# Patient Record
Sex: Female | Born: 1960 | Race: White | Hispanic: No | Marital: Married | Smoking: Never smoker
Health system: Southern US, Community
[De-identification: ages and names within clinical notes are randomized; demographics above are authoritative.]

## PROBLEM LIST (undated history)

## (undated) ENCOUNTER — Emergency Department (HOSPITAL_COMMUNITY): Payer: Medicare HMO

## (undated) DIAGNOSIS — F329 Major depressive disorder, single episode, unspecified: Secondary | ICD-10-CM

## (undated) DIAGNOSIS — C55 Malignant neoplasm of uterus, part unspecified: Secondary | ICD-10-CM

## (undated) DIAGNOSIS — N200 Calculus of kidney: Secondary | ICD-10-CM

## (undated) DIAGNOSIS — M797 Fibromyalgia: Secondary | ICD-10-CM

## (undated) DIAGNOSIS — F32A Depression, unspecified: Secondary | ICD-10-CM

## (undated) DIAGNOSIS — M1990 Unspecified osteoarthritis, unspecified site: Secondary | ICD-10-CM

## (undated) DIAGNOSIS — E7800 Pure hypercholesterolemia, unspecified: Secondary | ICD-10-CM

## (undated) DIAGNOSIS — F410 Panic disorder [episodic paroxysmal anxiety] without agoraphobia: Secondary | ICD-10-CM

## (undated) DIAGNOSIS — R5382 Chronic fatigue, unspecified: Secondary | ICD-10-CM

## (undated) HISTORY — DX: Calculus of kidney: N20.0

## (undated) HISTORY — DX: Depression, unspecified: F32.A

## (undated) HISTORY — DX: Fibromyalgia: M79.7

## (undated) HISTORY — PX: BREAST SURGERY: SHX581

## (undated) HISTORY — DX: Pure hypercholesterolemia, unspecified: E78.00

## (undated) HISTORY — PX: EYE SURGERY: SHX253

## (undated) HISTORY — DX: Major depressive disorder, single episode, unspecified: F32.9

## (undated) HISTORY — DX: Chronic fatigue, unspecified: R53.82

## (undated) HISTORY — DX: Malignant neoplasm of uterus, part unspecified: C55

## (undated) HISTORY — DX: Panic disorder (episodic paroxysmal anxiety): F41.0

---

## 1984-07-10 HISTORY — PX: TUBAL LIGATION: SHX77

## 2000-07-10 HISTORY — PX: APPENDECTOMY: SHX54

## 2001-01-22 ENCOUNTER — Other Ambulatory Visit: Admission: RE | Admit: 2001-01-22 | Discharge: 2001-01-22 | Payer: Self-pay | Admitting: Obstetrics and Gynecology

## 2001-01-28 ENCOUNTER — Encounter: Payer: Self-pay | Admitting: Obstetrics and Gynecology

## 2001-01-28 ENCOUNTER — Ambulatory Visit (HOSPITAL_COMMUNITY): Admission: RE | Admit: 2001-01-28 | Discharge: 2001-01-28 | Payer: Self-pay | Admitting: Obstetrics and Gynecology

## 2002-07-10 HISTORY — PX: VESICOVAGINAL FISTULA CLOSURE W/ TAH: SUR271

## 2004-07-13 ENCOUNTER — Inpatient Hospital Stay (HOSPITAL_COMMUNITY): Admission: RE | Admit: 2004-07-13 | Discharge: 2004-07-16 | Payer: Self-pay | Admitting: Obstetrics & Gynecology

## 2005-12-22 ENCOUNTER — Emergency Department (HOSPITAL_COMMUNITY): Admission: EM | Admit: 2005-12-22 | Discharge: 2005-12-22 | Payer: Self-pay | Admitting: Emergency Medicine

## 2005-12-29 ENCOUNTER — Emergency Department (HOSPITAL_COMMUNITY): Admission: EM | Admit: 2005-12-29 | Discharge: 2005-12-30 | Payer: Self-pay | Admitting: Emergency Medicine

## 2006-04-18 ENCOUNTER — Encounter: Admission: RE | Admit: 2006-04-18 | Discharge: 2006-04-18 | Payer: Self-pay | Admitting: Obstetrics and Gynecology

## 2007-10-31 ENCOUNTER — Emergency Department (HOSPITAL_COMMUNITY): Admission: EM | Admit: 2007-10-31 | Discharge: 2007-10-31 | Payer: Self-pay | Admitting: Emergency Medicine

## 2010-11-25 NOTE — H&P (Signed)
NAMEJOSILYN, Mendoza NO.:  [id number]   MEDICAL RECORD NO.:  [id number]           PATIENT TYPE:   LOCATION:                                FACILITY:  [facility]   PHYSICIAN:  Lazaro Arms, M.D.   DATE OF BIRTH:  Nov 14, 1960   DATE OF ADMISSION:  DATE OF DISCHARGE:  LH                                HISTORY & PHYSICAL   HISTORY OF PRESENT ILLNESS:  Kirsten Mendoza is a 50 year old female gravida 2, para  1, abortus 1 status post tubal ligation who has been followed in our office  for many, many years.  She did have a significant break in her care.  She  came in the office on May 12, 2004 having heavy bleeding with clots but  she could not take any contraception due to migraine headaches she gets  every time with them.  She was having severe cramping as well at that time.  She was placed on Megace and given Lortab for pain.  I saw her for an  ultrasound and indeed she does have an enlarged fibroid uterus with one  fibroid approximately 4.5 x 5 cm in diameter.  Her uterus is enlarged  approximately 12-14 weeks size and as a result after talking over the  options with [name] we are proceeding with an abdominal hysterectomy.  If  the ovaries are normal they will be left in place.   PAST MEDICAL HISTORY:  Significant for kidney stones.   PAST SURGICAL HISTORY:  She had a cesarean section.  She has also had a D&C  in the past.  She had a tubal ligation.   PAST OBSTETRICAL HISTORY:  As above.   ALLERGIES:  PENICILLIN.   REVIEW OF SYSTEMS:  Otherwise negative.   PHYSICAL EXAMINATION:  HEENT:  Unremarkable.  NECK:  Thyroid is normal.  LUNGS:  Clear.  HEART:  Regular rate and rhythm without regurgitation or gallop.  BREASTS:  Without mass, discharge, or skin changes.  ABDOMEN:  Benign.  No hepatosplenomegaly.  No masses.  PELVIC:  She has normal external genitalia.  The vagina is pink, moist.  No  discharge.  Cervix is parous without lesions.  Uterus is 12 weeks size.  Adnexa is negative.  EXTREMITIES:  Warm with no edema.  NEUROLOGIC:  Grossly intact.   IMPRESSION:  1.  Menometrorrhagia.  2.  Dysmenorrhea.  3.  Enlarged fibroid uterus.   PLAN:  Patient is admitted for an abdominal hysterectomy.  She understands  the risk of surgery including infection, bleeding, and damage to other  organs.  We discussed the alternatives.  The patient opted for abdominal  hysterectomy and she understands the benefits of surgery as well.  All  questions were answered.     Luth   LHE/MEDQ  D:  07/12/2004  T:  07/12/2004  Job:  [id number]

## 2010-11-25 NOTE — Discharge Summary (Signed)
[name], Kirsten Mendoza                ACCOUNT NO.:  [id number]   MEDICAL RECORD NO.:  [id number]          PATIENT TYPE:  INP   LOCATION:  A419                          FACILITY:  [facility]   PHYSICIAN:  Lazaro Arms, M.D.   DATE OF BIRTH:  1961/06/08   DATE OF ADMISSION:  07/13/2004  DATE OF DISCHARGE:  01/07/2006LH                                 DISCHARGE SUMMARY   DISCHARGE DIAGNOSES:  Status post an abdominal hysterectomy with anterior  abdominal wall scar revision.   POST DIAGNOSES:  Status post an abdominal hysterectomy with anterior  abdominal wall scar revision.   PROCEDURE:  Abdominal hysterectomy with anterior abdominal wall scar  revision.   Please refer to the transcribed history and physical for details of  admission to the hospital.   HOSPITAL COURSE:  Patient was admitted postoperatively.  Please refer to the  operative note for details.  She tolerated clear liquids and a regular diet.  She voided without symptoms.  She was ambulatory.  Her JP drain was left in  place and will be taken out as an outpatient.  Her postoperative hemoglobin  and hematocrit were stable at 12 and 34.  She was ambulatory, tolerated oral  pain medicine, was discharged home on the morning of postoperative day #3 in  good, stable condition.  She will follow up in the office next week to have  her incision examined and her drain removed.  If she has any difficulty with  voiding she will let us know.  She is discharged to home with Toradol,  Tylox, Levaquin, and Diflucan.      LHE/MEDQ  D:  08/17/2004  T:  08/17/2004  Job:  [id number]

## 2010-11-25 NOTE — Op Note (Signed)
Kirsten Mendoza, DOUBLE                ACCOUNT NO.:  [id number]   MEDICAL RECORD NO.:  [id number]          PATIENT TYPE:  AMB   LOCATION:  DAY                           FACILITY:  [facility]   PHYSICIAN:  Lazaro Arms, M.D.   DATE OF BIRTH:  07-Jul-1961   DATE OF PROCEDURE:  DATE OF DISCHARGE:                                 OPERATIVE REPORT   PREOPERATIVE DIAGNOSES:  1.  Menometrorrhagia.  2.  Dysmenorrhea.  3.  Fibroid uterus.   POSTOPERATIVE DIAGNOSES:  1.  Menometrorrhagia.  2.  Dysmenorrhea.  3.  Fibroid uterus.   PROCEDURE:  Abdominal hysterectomy with revision of previous scar.   SURGEON:  Lazaro Arms, M.D.   ANESTHESIA:  General endotracheal.   FINDINGS:  The patient had what I thought was a solitary fibroid in her  uterus at the time of her ultrasound.  She may have had other smaller ones,  but nothing dominant that was confirmed today.  Her tubes and ovaries were  otherwise normal.  There was no adhesive disease.  Everything appeared to be  normal.   DESCRIPTION OF PROCEDURE:  The patient was taken to the operating room and  placed in the supine position where she underwent general endotracheal  anesthesia.  She was prepped and draped in the usual sterile fashion.  A  Foley catheter was placed.  The vagina was prepped.  A vertical skin  incision was made left lateral of her previous scar and taken down to the  rectus fascia which was scored in the midline and extended superiorly and  inferiorly.  The muscles were divided.  The peritoneal cavity was entered.  A medium protractor was used to hold back the abdominal wall, and the upper  abdomen was packed away, and the patient was placed in the Trendelenburg  position.  The uterine cornua were grasped.  The left round ligament was  suture ligated and cut.  The right round ligament was suture ligated and  cut.  The uteroovarian ligament was cross-clamped, cut, and suture ligated.  The vesicouterine serosal implant was  taken down, and the uteroovarian  ligament on the right was clamped, cut, and suture ligated.  The uterine  vessels were skeletonized, clamped, cut, and suture ligated.  Serial  pedicles were taken down to the cervix through the cardinal ligament, each  pedicle being clamped, cut, and transfixion suture ligated.  Vaginal angles  were encountered.  The specimen was removed.  The vaginal angle sutures were  placed.  The vagina was closed with interrupted figure-of-eight sutures.   The patient tolerated the procedure well.  She experienced 150 mL of blood  loss.  Surgicel and Interceed was placed in the vaginal cuff to prevent  postoperative adhesions.  The pelvis had already been irrigated vigorously.  The subcutaneous tissue, fascia, muscle, and peritoneum were closed in a  modified Smead-Jones fashion, far-far, near-near, using a 0 PDS.  The  subcutaneous tissue was made hemostatic and irrigated.  This previous scar  was then removed sharply, and the subcutaneous tissue was made hemostatic  and irrigated.  The  subcutaneous 2-0 plain gut sutures were placed to  obliterate dead space, and a JP drain was placed in the right lower  quadrant.  The skin was closed using skin staples.  The patient tolerated  the procedure well.  She experienced 150 mL of blood loss and was taken to  the recovery room in good stable condition.  All counts were correct.     Luth   LHE/MEDQ  D:  07/13/2004  T:  07/13/2004  Job:  [id number]

## 2011-04-04 LAB — COMPREHENSIVE METABOLIC PANEL
ALT: 19
Alkaline Phosphatase: 44
BUN: 5 — ABNORMAL LOW
CO2: 28
Chloride: 105
GFR calc non Af Amer: >60
Glucose, Bld: 95
Potassium: 3.6
Sodium: 136
Total Bilirubin: 0.8

## 2011-04-04 LAB — DIFFERENTIAL
Basophils Absolute: 0
Basophils Relative: 0
Eosinophils Absolute: 0.1
Monocytes Relative: 9
Neutro Abs: 2.8
Neutrophils Relative %: 61

## 2011-04-04 LAB — CBC
HCT: 34.7 — ABNORMAL LOW
Hemoglobin: 12.2
RBC: 3.69 — ABNORMAL LOW
RDW: 12.7
WBC: 4.6

## 2011-04-04 LAB — APTT: aPTT: 32

## 2011-04-04 LAB — PROTIME-INR: INR: 1

## 2013-05-21 ENCOUNTER — Other Ambulatory Visit: Payer: Self-pay | Admitting: Obstetrics & Gynecology

## 2013-08-25 ENCOUNTER — Institutional Professional Consult (permissible substitution): Payer: Self-pay | Admitting: Pulmonary Disease

## 2013-09-08 ENCOUNTER — Other Ambulatory Visit (HOSPITAL_COMMUNITY): Payer: Self-pay

## 2013-09-12 ENCOUNTER — Institutional Professional Consult (permissible substitution): Payer: Self-pay | Admitting: Pulmonary Disease

## 2013-09-23 ENCOUNTER — Encounter: Payer: Self-pay | Admitting: Cardiovascular Disease

## 2013-09-23 ENCOUNTER — Other Ambulatory Visit (HOSPITAL_COMMUNITY): Payer: Self-pay | Admitting: Family Medicine

## 2013-09-23 ENCOUNTER — Ambulatory Visit (HOSPITAL_COMMUNITY): Payer: BC Managed Care – PPO | Attending: Cardiovascular Disease | Admitting: Radiology

## 2013-09-23 DIAGNOSIS — R0602 Shortness of breath: Secondary | ICD-10-CM | POA: Insufficient documentation

## 2013-09-23 DIAGNOSIS — E785 Hyperlipidemia, unspecified: Secondary | ICD-10-CM | POA: Insufficient documentation

## 2013-09-23 NOTE — Progress Notes (Signed)
Echocardiogram performed.  

## 2013-09-29 ENCOUNTER — Ambulatory Visit (INDEPENDENT_AMBULATORY_CARE_PROVIDER_SITE_OTHER): Payer: BC Managed Care – PPO | Admitting: Pulmonary Disease

## 2013-09-29 ENCOUNTER — Encounter: Payer: Self-pay | Admitting: Pulmonary Disease

## 2013-09-29 ENCOUNTER — Other Ambulatory Visit (INDEPENDENT_AMBULATORY_CARE_PROVIDER_SITE_OTHER): Payer: BC Managed Care – PPO

## 2013-09-29 VITALS — BP 136/78 | HR 87 | Ht 65.0 in | Wt 196.0 lb

## 2013-09-29 DIAGNOSIS — R0602 Shortness of breath: Secondary | ICD-10-CM

## 2013-09-29 LAB — CBC
HEMATOCRIT: 42.8 % (ref 36.0–46.0)
HEMOGLOBIN: 14.2 g/dL (ref 12.0–15.0)
MCHC: 33.2 g/dL (ref 30.0–36.0)
MCV: 92 fl (ref 78.0–100.0)
Platelets: 243 10*3/uL (ref 150.0–400.0)
RBC: 4.66 Mil/uL (ref 3.87–5.11)
RDW: 12.7 % (ref 11.5–14.6)
WBC: 6.8 10*3/uL (ref 4.5–10.5)

## 2013-09-29 NOTE — Patient Instructions (Signed)
Exercise regularly to try to lose weight We will arrange a pulmonary function test, Chest x-ray, blood work, and a stress test We will call you with those results We will see you back in 3 months or sooner if needed

## 2013-09-29 NOTE — Progress Notes (Signed)
Subjective:    Patient ID: Kirsten Mendoza, female    DOB: 10-Oct-1960, 53 y.o.   MRN: [id number]  HPI  Kirsten Mendoza is here to see me today because she has been having more shortness of breath.  She has gained 75 pounds in the last year and has developed shortness of breath with this.  She has noticed that just folding clothes and other activties with minimal exertion would make her short of breath.  Sometimes this comes on at rest "out of nowhere".  She definitely gets short of breath with climbing a flight of stairs.  She continues to walk but this has been limited by the weather lately. She typically walks about 1.5 miles at a time.  This has been getting a little harder as well.  She often feels weak and tired.  She feels like she can't get enough air in and feels that her breathing is "strong enough".  She frequently yawns to take a deeper breath.   She has never had a significant respiratory problem other than pneumonia three times as a teenager and once as an adolescent. She was never hospitlized. She had "walking pneumonia" as an adult once.      Past Medical History  Diagnosis Date  . High cholesterol   . hx of uterine cancer   . Kidney stones   . Fibromyalgia   . Chronic fatigue   . Depression   . Panic attacks      Family History  Problem Relation Age of Onset  . Allergies Father   . Heart disease Maternal Grandmother   . Heart disease Maternal Grandfather   . Heart disease Paternal Grandfather   . Heart disease Paternal Grandmother   . Rheum arthritis Maternal Aunt   . Arthritis Mother   . Cancer Mother     breast cancer     History   Social History  . Marital Status: Married    Spouse Name: N/A    Number of Children: N/A  . Years of Education: N/A   Occupational History  . Not on file.   Social History Main Topics  . Smoking status: Never Smoker   . Smokeless tobacco: Never Used  . Alcohol Use: Yes     Comment: social per pt  . Drug Use: No  . Sexual  Activity: Not on file   Other Topics Concern  . Not on file   Social History Narrative  . No narrative on file     Allergies  Allergen Reactions  . Penicillins     Hives, throat swelling  . Sulfa Antibiotics     Hives, breathing difficulties     No outpatient prescriptions prior to visit.   No facility-administered medications prior to visit.      Review of Systems  Constitutional: Positive for fatigue and unexpected weight change. Negative for fever.  HENT: Positive for congestion, postnasal drip, sneezing and sore throat. Negative for dental problem, ear pain, nosebleeds, rhinorrhea, sinus pressure and trouble swallowing.   Eyes: Negative for redness and itching.  Respiratory: Positive for cough, chest tightness and shortness of breath. Negative for wheezing.   Cardiovascular: Negative for palpitations and leg swelling.  Gastrointestinal: Negative for nausea and vomiting.  Genitourinary: Negative for dysuria.  Musculoskeletal: Positive for joint swelling.  Skin: Negative for rash.  Neurological: Negative for headaches.  Hematological: Does not bruise/bleed easily.  Psychiatric/Behavioral: Positive for dysphoric mood. The patient is not nervous/anxious.        Objective:  Physical Exam Filed Vitals:   09/29/13 1535  BP: 136/78  Pulse: 87  Height: 5\' 5"  (1.651 m)  Weight: 196 lb (88.905 kg)  SpO2: 96%  RA  Gen: well appearing, no acute distress HEENT: NCAT, PERRL, EOMi, OP clear, neck supple without masses PULM: CTA B CV: RRR, no mgr, no JVD AB: BS+, soft, nontender, no hsm Ext: warm, no edema, no clubbing, no cyanosis Derm: no rash or skin breakdown Neuro: A&Ox4, CN II-XII intact, strength 5/5 in all 4 extremities  09/23/2013 Echo> Normal LVEF, mild LVH, normal RV/RA      Assessment & Plan:   Shortness of breath I explained to Kirsten Mendoza today that the differential diagnosis for shortness of breath is quite lengthy. It includes lung disease, cardiac  disease, hematologic disease, and neurologic disease. Today, her pulmonary exam is completely normal, as is her ambulatory oxygenation. We will check a chest x-ray as well as pulmonary function testing but I doubt we're going to find evidence of pulmonary disease. I am encouraged that she had a normal echocardiogram but I like to complete the cardiac workup with a stress test. I would also like to check a CBC to see what her hemoglobin shows. She has a normal neurologic exam so I do not anticipate there being any evidence of restrictive lung disease from a neurologic process.  If the above workup is negative then the most likely explanation for her shortness of breath would be deconditioning and obesity secondary to the profound weight gain from the last year.  Plan: -Full pulmonary function test -Chest x-ray -Stress test -CBC -TSH -Stay active and try to lose weight with diet and exercise    Updated Medication List Outpatient Encounter Prescriptions as of 09/29/2013  Medication Sig  . clonazePAM (KLONOPIN) 1 MG tablet Take 1 mg by mouth 4 (four) times daily as needed for anxiety.  . DULoxetine (CYMBALTA) 60 MG capsule Take 60 mg by mouth daily.  Kirsten Kitchen gabapentin (NEURONTIN) 300 MG capsule Take 300 mg by mouth 3 (three) times daily.  . simvastatin (ZOCOR) 10 MG tablet Take 10 mg by mouth daily.  Kirsten Kitchen zolpidem (AMBIEN) 5 MG tablet Take 5 mg by mouth at bedtime as needed for sleep.

## 2013-09-30 ENCOUNTER — Telehealth: Payer: Self-pay | Admitting: Emergency Medicine

## 2013-09-30 DIAGNOSIS — R0602 Shortness of breath: Secondary | ICD-10-CM | POA: Insufficient documentation

## 2013-09-30 NOTE — Assessment & Plan Note (Signed)
I explained to Kirsten Mendoza today that the differential diagnosis for shortness of breath is quite lengthy. It includes lung disease, cardiac disease, hematologic disease, and neurologic disease. Today, her pulmonary exam is completely normal, as is her ambulatory oxygenation. We will check a chest x-ray as well as pulmonary function testing but I doubt we're going to find evidence of pulmonary disease. I am encouraged that she had a normal echocardiogram but I like to complete the cardiac workup with a stress test. I would also like to check a CBC to see what her hemoglobin shows. She has a normal neurologic exam so I do not anticipate there being any evidence of restrictive lung disease from a neurologic process.  If the above workup is negative then the most likely explanation for her shortness of breath would be deconditioning and obesity secondary to the profound weight gain from the last year.  Plan: -Full pulmonary function test -Chest x-ray -Stress test -CBC -TSH -Stay active and try to lose weight with diet and exercise

## 2013-09-30 NOTE — Telephone Encounter (Signed)
Message copied by Maurice March on Tue Sep 30, 2013  5:28 PM ------      Message from: Juanito Doom      Created: Tue Sep 30, 2013  8:45 AM       E,            Please let her know that her ONO was OK.                  Thanks      B ------

## 2013-10-03 NOTE — Telephone Encounter (Signed)
lmtcb X 2 

## 2013-10-06 NOTE — Telephone Encounter (Signed)
Spoke with pt, she did not have an ONO done.

## 2013-10-29 ENCOUNTER — Encounter: Payer: BC Managed Care – PPO | Admitting: Nurse Practitioner

## 2013-12-04 ENCOUNTER — Ambulatory Visit (INDEPENDENT_AMBULATORY_CARE_PROVIDER_SITE_OTHER): Payer: BC Managed Care – PPO | Admitting: Pulmonary Disease

## 2013-12-04 ENCOUNTER — Ambulatory Visit (INDEPENDENT_AMBULATORY_CARE_PROVIDER_SITE_OTHER)
Admission: RE | Admit: 2013-12-04 | Discharge: 2013-12-04 | Disposition: A | Discharge status: Home / Self Care | Payer: BC Managed Care – PPO | Source: Ambulatory Visit | Attending: Pulmonary Disease | Admitting: Pulmonary Disease

## 2013-12-04 DIAGNOSIS — R0602 Shortness of breath: Secondary | ICD-10-CM

## 2013-12-04 NOTE — Progress Notes (Signed)
PFT done today. 

## 2013-12-05 ENCOUNTER — Encounter: Payer: BC Managed Care – PPO | Admitting: Nurse Practitioner

## 2013-12-05 ENCOUNTER — Encounter: Payer: Self-pay | Admitting: Pulmonary Disease

## 2013-12-05 NOTE — Progress Notes (Signed)
Quick Note:  Spoke with pt, she is aware of results. Nothing further needed at this time. ______ 

## 2013-12-05 NOTE — Progress Notes (Signed)
Quick Note:  lmtcb X1 ______ 

## 2013-12-08 ENCOUNTER — Telehealth: Payer: Self-pay | Admitting: Pulmonary Disease

## 2013-12-08 NOTE — Telephone Encounter (Signed)
Spoke with pt to relay results about pft.  She is aware.  Nothing further needed.

## 2013-12-08 NOTE — Progress Notes (Signed)
Quick Note:  Spoke with pt, she is aware of results. Nothing further needed at this time. ______ 

## 2014-01-14 ENCOUNTER — Encounter: Payer: BC Managed Care – PPO | Admitting: Nurse Practitioner

## 2014-04-16 LAB — PULMONARY FUNCTION TEST
DL/VA % PRED: 103 %
DL/VA: 4.96 ml/min/mmHg/L
DLCO UNC % PRED: 88 %
DLCO UNC: 21.42 ml/min/mmHg
FEF 25-75 POST: 3.63 L/s
FEF 25-75 PRE: 3.9 L/s
FEF2575-%Change-Post: -6 %
FEF2575-%PRED-PRE: 147 %
FEF2575-%Pred-Post: 137 %
FEV1-%Change-Post: 0 %
FEV1-%Pred-Post: 95 %
FEV1-%Pred-Pre: 95 %
FEV1-POST: 2.62 L
FEV1-Pre: 2.62 L
FEV1FVC-%CHANGE-POST: 2 %
FEV1FVC-%Pred-Pre: 107 %
FEV6-%CHANGE-POST: -2 %
FEV6-%PRED-POST: 88 %
FEV6-%PRED-PRE: 90 %
FEV6-Post: 2.98 L
FEV6-Pre: 3.04 L
FEV6FVC-%CHANGE-POST: 0 %
FEV6FVC-%PRED-POST: 102 %
FEV6FVC-%Pred-Pre: 102 %
FVC-%CHANGE-POST: -2 %
FVC-%PRED-POST: 85 %
FVC-%Pred-Pre: 87 %
FVC-PRE: 3.05 L
FVC-Post: 2.98 L
PRE FEV1/FVC RATIO: 86 %
Post FEV1/FVC ratio: 88 %
Post FEV6/FVC ratio: 100 %
Pre FEV6/FVC Ratio: 100 %
RV % pred: 61 %
RV: 1.14 L
TLC % pred: 84 %
TLC: 4.28 L

## 2016-04-10 ENCOUNTER — Other Ambulatory Visit: Payer: Self-pay | Admitting: Family Medicine

## 2016-04-10 DIAGNOSIS — Z1231 Encounter for screening mammogram for malignant neoplasm of breast: Secondary | ICD-10-CM

## 2016-05-30 ENCOUNTER — Other Ambulatory Visit: Payer: Self-pay | Admitting: Family Medicine

## 2016-05-30 DIAGNOSIS — Z803 Family history of malignant neoplasm of breast: Secondary | ICD-10-CM

## 2016-05-30 DIAGNOSIS — N6320 Unspecified lump in the left breast, unspecified quadrant: Secondary | ICD-10-CM

## 2016-05-30 DIAGNOSIS — R2232 Localized swelling, mass and lump, left upper limb: Secondary | ICD-10-CM

## 2016-06-09 ENCOUNTER — Ambulatory Visit
Admission: RE | Admit: 2016-06-09 | Discharge: 2016-06-09 | Disposition: A | Discharge status: Home / Self Care | Payer: PPO | Source: Ambulatory Visit | Attending: Family Medicine | Admitting: Family Medicine

## 2016-06-09 ENCOUNTER — Other Ambulatory Visit: Payer: Self-pay | Admitting: Family Medicine

## 2016-06-09 DIAGNOSIS — N6320 Unspecified lump in the left breast, unspecified quadrant: Secondary | ICD-10-CM

## 2016-06-09 DIAGNOSIS — R2232 Localized swelling, mass and lump, left upper limb: Secondary | ICD-10-CM

## 2016-06-09 DIAGNOSIS — Z803 Family history of malignant neoplasm of breast: Secondary | ICD-10-CM

## 2016-06-09 DIAGNOSIS — N6323 Unspecified lump in the left breast, lower outer quadrant: Secondary | ICD-10-CM | POA: Diagnosis not present

## 2016-06-09 DIAGNOSIS — R928 Other abnormal and inconclusive findings on diagnostic imaging of breast: Secondary | ICD-10-CM | POA: Diagnosis not present

## 2016-06-13 ENCOUNTER — Inpatient Hospital Stay: Admission: RE | Admit: 2016-06-13 | Payer: PPO | Source: Ambulatory Visit

## 2016-06-19 ENCOUNTER — Other Ambulatory Visit: Payer: Self-pay | Admitting: Family Medicine

## 2016-06-19 ENCOUNTER — Ambulatory Visit
Admission: RE | Admit: 2016-06-19 | Discharge: 2016-06-19 | Disposition: A | Discharge status: Home / Self Care | Payer: PPO | Source: Ambulatory Visit | Attending: Family Medicine | Admitting: Family Medicine

## 2016-06-19 DIAGNOSIS — Z803 Family history of malignant neoplasm of breast: Secondary | ICD-10-CM

## 2016-06-19 DIAGNOSIS — R2232 Localized swelling, mass and lump, left upper limb: Secondary | ICD-10-CM

## 2016-06-19 DIAGNOSIS — N6320 Unspecified lump in the left breast, unspecified quadrant: Secondary | ICD-10-CM

## 2016-06-19 DIAGNOSIS — N6323 Unspecified lump in the left breast, lower outer quadrant: Secondary | ICD-10-CM | POA: Diagnosis not present

## 2016-06-19 DIAGNOSIS — N6310 Unspecified lump in the right breast, unspecified quadrant: Secondary | ICD-10-CM

## 2016-06-19 DIAGNOSIS — N6012 Diffuse cystic mastopathy of left breast: Secondary | ICD-10-CM | POA: Diagnosis not present

## 2016-06-19 HISTORY — PX: BREAST BIOPSY: SHX20

## 2016-07-04 ENCOUNTER — Other Ambulatory Visit: Payer: PPO

## 2016-07-07 ENCOUNTER — Emergency Department (HOSPITAL_COMMUNITY)
Admission: EM | Admit: 2016-07-07 | Discharge: 2016-07-07 | Disposition: A | Discharge status: Home / Self Care | Payer: PPO | Attending: Emergency Medicine | Admitting: Emergency Medicine

## 2016-07-07 ENCOUNTER — Encounter (HOSPITAL_COMMUNITY): Payer: Self-pay | Admitting: Emergency Medicine

## 2016-07-07 ENCOUNTER — Emergency Department (HOSPITAL_COMMUNITY): Payer: PPO

## 2016-07-07 DIAGNOSIS — Z8542 Personal history of malignant neoplasm of other parts of uterus: Secondary | ICD-10-CM | POA: Diagnosis not present

## 2016-07-07 DIAGNOSIS — R079 Chest pain, unspecified: Secondary | ICD-10-CM | POA: Diagnosis not present

## 2016-07-07 DIAGNOSIS — R200 Anesthesia of skin: Secondary | ICD-10-CM | POA: Diagnosis not present

## 2016-07-07 DIAGNOSIS — Z79899 Other long term (current) drug therapy: Secondary | ICD-10-CM | POA: Diagnosis not present

## 2016-07-07 DIAGNOSIS — R51 Headache: Secondary | ICD-10-CM | POA: Diagnosis not present

## 2016-07-07 DIAGNOSIS — R072 Precordial pain: Secondary | ICD-10-CM | POA: Diagnosis not present

## 2016-07-07 HISTORY — DX: Unspecified osteoarthritis, unspecified site: M19.90

## 2016-07-07 LAB — COMPREHENSIVE METABOLIC PANEL
ALBUMIN: 4 g/dL (ref 3.5–5.0)
ALT: 18 U/L (ref 14–54)
ANION GAP: 6 (ref 5–15)
AST: 23 U/L (ref 15–41)
Alkaline Phosphatase: 110 U/L (ref 38–126)
BUN: 6 mg/dL (ref 6–20)
CO2: 27 mmol/L (ref 22–32)
Calcium: 8.6 mg/dL — ABNORMAL LOW (ref 8.9–10.3)
Chloride: 101 mmol/L (ref 101–111)
Creatinine, Ser: 1.1 mg/dL — ABNORMAL HIGH (ref 0.44–1.00)
GFR calc Af Amer: >60 mL/min (ref >60)
GFR calc non Af Amer: 55 mL/min — ABNORMAL LOW (ref >60)
GLUCOSE: 145 mg/dL — AB (ref 65–99)
POTASSIUM: 3.4 mmol/L — AB (ref 3.5–5.1)
Sodium: 134 mmol/L — ABNORMAL LOW (ref 135–145)
Total Bilirubin: 0.5 mg/dL (ref 0.3–1.2)
Total Protein: 7.5 g/dL (ref 6.5–8.1)

## 2016-07-07 LAB — CBC WITH DIFFERENTIAL/PLATELET
BASOS ABS: 0 10*3/uL (ref 0.0–0.1)
Basophils Relative: 1 %
EOS PCT: 7 %
Eosinophils Absolute: 0.5 10*3/uL (ref 0.0–0.7)
HEMATOCRIT: 41.9 % (ref 36.0–46.0)
Hemoglobin: 13.8 g/dL (ref 12.0–15.0)
LYMPHS ABS: 2.5 10*3/uL (ref 0.7–4.0)
LYMPHS PCT: 37 %
MCH: 30.3 pg (ref 26.0–34.0)
MCHC: 32.9 g/dL (ref 30.0–36.0)
MCV: 92.1 fL (ref 78.0–100.0)
MONO ABS: 0.3 10*3/uL (ref 0.1–1.0)
Monocytes Relative: 4 %
NEUTROS ABS: 3.5 10*3/uL (ref 1.7–7.7)
Neutrophils Relative %: 51 %
PLATELETS: 234 10*3/uL (ref 150–400)
RBC: 4.55 MIL/uL (ref 3.87–5.11)
RDW: 13 % (ref 11.5–15.5)
WBC: 6.7 10*3/uL (ref 4.0–10.5)

## 2016-07-07 LAB — TROPONIN I: Troponin I: <0.03 ng/mL (ref <0.03)

## 2016-07-07 MED ORDER — SODIUM CHLORIDE 0.9 % IV BOLUS (SEPSIS)
1000.0000 mL | Freq: Once | INTRAVENOUS | Status: AC
  Administered 2016-07-07: 1000 mL via INTRAVENOUS

## 2016-07-07 MED ORDER — IOPAMIDOL (ISOVUE-370) INJECTION 76%
100.0000 mL | Freq: Once | INTRAVENOUS | Status: AC | PRN
  Administered 2016-07-07: 100 mL via INTRAVENOUS

## 2016-07-07 MED ORDER — HYDROMORPHONE HCL 1 MG/ML IJ SOLN: 1.0000 mg | Freq: Once | INTRAMUSCULAR | Status: DC

## 2016-07-07 MED ORDER — FENTANYL CITRATE (PF) 100 MCG/2ML IJ SOLN
50.0000 ug | Freq: Once | INTRAMUSCULAR | Status: AC
  Administered 2016-07-07: 50 ug via INTRAVENOUS
  Filled 2016-07-07: qty 2

## 2016-07-07 MED ORDER — DICYCLOMINE HCL 10 MG PO CAPS: 10.0000 mg | ORAL_CAPSULE | Freq: Once | ORAL | Status: DC

## 2016-07-07 MED ORDER — KETOROLAC TROMETHAMINE 30 MG/ML IJ SOLN: 15.0000 mg | Freq: Once | INTRAMUSCULAR | Status: DC

## 2016-07-07 NOTE — ED Triage Notes (Signed)
Per Husband, pt reports pain in left chest radiating to left arm, left arm numbness, right arm numbness, mouth numbness, slurred speech, and difficulty speaking.  Symptoms started around 1700 per husband.  Dr. Dayna Barker at bedside evaluating pt at this time.

## 2016-07-07 NOTE — ED Notes (Signed)
ED Provider at bedside. 

## 2016-07-07 NOTE — ED Provider Notes (Signed)
[facility] DEPT Provider Note   CSN: [id number] Arrival date & time: 07/07/16  1711     History   Chief Complaint Chief Complaint  Patient presents with  . Extremity Weakness  . Chest Pain    HPI ARDEN BEEL is a 55 y.o. female.   Chest Pain   This is a new problem. The current episode started less than 1 hour ago. The problem occurs constantly. The problem has been resolved. The pain is associated with rest. The pain is present in the substernal region. The pain is moderate. The quality of the pain is described as brief. The pain does not radiate. Associated symptoms include headaches. Pertinent negatives include no cough, no fever, no nausea and no shortness of breath. She has tried nothing for the symptoms. The treatment provided no relief.    Past Medical History:  Diagnosis Date  . Arthritis   . Chronic fatigue   . Depression   . Fibromyalgia   . High cholesterol   . hx of uterine cancer   . Kidney stones   . Panic attacks     Patient Active Problem List   Diagnosis Date Noted  . Shortness of breath 09/30/2013    Past Surgical History:  Procedure Laterality Date  . APPENDECTOMY  2002  . BREAST SURGERY     biopsy  . CESAREAN SECTION  1981  . EYE SURGERY     lasic  . TUBAL LIGATION  1986  . VESICOVAGINAL FISTULA CLOSURE W/ TAH  2004    OB History    No data available       Home Medications    Prior to Admission medications   Medication Sig Start Date End Date Taking? Authorizing Provider  clonazePAM (KLONOPIN) 1 MG tablet Take 1 mg by mouth 4 (four) times daily as needed for anxiety.   Yes Historical Provider, MD  DULoxetine (CYMBALTA) 60 MG capsule Take 60 mg by mouth daily.   Yes Historical Provider, MD  gabapentin (NEURONTIN) 600 MG tablet Take 300 mg by mouth 3 (three) times daily.   Yes Historical Provider, MD  traMADol (ULTRAM) 50 MG tablet Take 50 mg by mouth every 6 (six) hours as needed for moderate pain or severe pain.   Yes  Historical Provider, MD  zolpidem (AMBIEN) 5 MG tablet Take 5 mg by mouth at bedtime as needed for sleep.   Yes Historical Provider, MD    Family History Family History  Problem Relation Age of Onset  . Allergies Father   . Heart disease Maternal Grandmother   . Heart disease Maternal Grandfather   . Heart disease Paternal Grandfather   . Heart disease Paternal Grandmother   . Rheum arthritis Maternal Aunt   . Arthritis Mother   . Cancer Mother     breast cancer    Social History Social History  Substance Use Topics  . Smoking status: Never Smoker  . Smokeless tobacco: Never Used  . Alcohol use Yes     Comment: social per pt     Allergies   Penicillins and Sulfa antibiotics   Review of Systems Review of Systems  Constitutional: Negative for chills and fever.  Eyes: Negative for pain.  Respiratory: Negative for cough and shortness of breath.   Cardiovascular: Positive for chest pain.  Gastrointestinal: Negative for nausea.  Neurological: Positive for headaches.  All other systems reviewed and are negative.    Physical Exam Updated Vital Signs BP 138/97   Pulse 85  Temp 98 F (36.7 C) (Oral)   Resp 26   Ht 5\' 4"  (1.626 m)   Wt 180 lb (81.6 kg)   SpO2 100%   BMI 30.90 kg/m   Physical Exam  Constitutional: She is oriented to person, place, and time. She appears well-developed and well-nourished.  HENT:  Head: Normocephalic and atraumatic.  Eyes: Conjunctivae and EOM are normal.  Neck: Normal range of motion.  Cardiovascular: Normal rate and regular rhythm.   Pulmonary/Chest: Effort normal. No stridor. No respiratory distress.  Abdominal: Soft. She exhibits no distension. There is no tenderness.  Musculoskeletal: Normal range of motion. She exhibits no edema or deformity.  Neurological: She is alert and oriented to person, place, and time.  No altered mental status, able to give full seemingly accurate history.  Face is symmetric, EOM's intact, pupils  equal and reactive, vision intact, tongue and uvula midline without deviation Upper and Lower extremity motor 5/5, intact pain perception in distal extremities, 2+ reflexes in biceps, patella and achilles tendons. Finger to nose normal, heel to shin normal.   Skin: Skin is warm and dry.  Nursing note and vitals reviewed.    ED Treatments / Results  Labs (all labs ordered are listed, but only abnormal results are displayed) Labs Reviewed  COMPREHENSIVE METABOLIC PANEL - Abnormal; Notable for the following:       Result Value   Sodium 134 (*)    Potassium 3.4 (*)    Glucose, Bld 145 (*)    Creatinine, Ser 1.10 (*)    Calcium 8.6 (*)    GFR calc non Af Amer 55 (*)    All other components within normal limits  CBC WITH DIFFERENTIAL/PLATELET  TROPONIN I  TROPONIN I  URINALYSIS, ROUTINE W REFLEX MICROSCOPIC    EKG  EKG Interpretation None       Radiology Ct Head Wo Contrast  Result Date: 07/07/2016 CLINICAL DATA:  Per Husband, pt reports pain in left chest radiating to left arm, left arm numbness, right arm numbness, mouth numbness, slurred speech, and difficulty speaking. Symptoms started around 1700 per husband. Hx of uterine cancer, breast surgery/bbj EXAM: CT HEAD WITHOUT CONTRAST TECHNIQUE: Contiguous axial images were obtained from the base of the skull through the vertex without intravenous contrast. COMPARISON:  PET-CT 10/31/2007 FINDINGS: Brain: No evidence of acute infarction, hemorrhage, hydrocephalus, extra-axial collection or mass lesion/mass effect. Vascular: No hyperdense vessel or unexpected calcification. Skull: Normal. Negative for fracture or focal lesion. Sinuses/Orbits: No acute finding. Other: None. IMPRESSION: No acute intracranial findings. Electronically Signed   By: Suzy Bouchard M.D.   On: 07/07/2016 19:02   Ct Angio Chest Aorta W And/or Wo Contrast  Result Date: 07/07/2016 CLINICAL DATA:  Per Husband, pt reports pain in left chest radiating to left  arm, left arm numbness, right arm numbness, mouth numbness, slurred speech, and difficulty speaking. Symptoms started around 1700 per husband. Hx of uterine cancer, breast surgery EXAM: CT ANGIOGRAPHY CHEST WITH CONTRAST TECHNIQUE: Multidetector CT imaging of the chest was performed using the standard protocol during bolus administration of intravenous contrast. Multiplanar CT image reconstructions and MIPs were obtained to evaluate the vascular anatomy. CONTRAST:  100 cc Isovue 370 COMPARISON:  Chest x-ray 12/04/2013 FINDINGS: Cardiovascular: Heart size is normal. No imaged pericardial effusion or significant coronary artery calcifications. Pulmonary arteries are well opacified. There is no evidence for acute pulmonary embolus. Mediastinum/Nodes: The visualized portion of the thyroid gland has a normal appearance. Small, nonspecific mediastinal lymph nodes are present, measuring  up to 7 mm in short axis. Lungs/Pleura: There is atelectasis/scarring within the right upper lobe. No focal consolidations. No pleural effusions. No suspicious pulmonary nodules. Upper Abdomen: Gallbladder is present. No acute abnormality within the upper abdomen. Small, nonspecific periportal lymph nodes are present, measuring of 7 mm short axis. Musculoskeletal: No chest wall abnormality. No acute or significant osseous findings. Review of the MIP images confirms the above findings. IMPRESSION: 1. Technically adequate exam showing no acute pulmonary embolus. 2.  No evidence for acute  abnormality. 3. Nonspecific small mediastinal and periportal lymph nodes. Electronically Signed   By: Nolon Nations M.D.   On: 07/07/2016 19:09    Procedures Procedures (including critical care time)  Medications Ordered in ED Medications  sodium chloride 0.9 % bolus 1,000 mL (0 mLs Intravenous Stopped 07/07/16 2103)  fentaNYL (SUBLIMAZE) injection 50 mcg (50 mcg Intravenous Given 07/07/16 1746)  iopamidol (ISOVUE-370) 76 % injection 100 mL (100  mLs Intravenous Contrast Given 07/07/16 1823)     Initial Impression / Assessment and Plan / ED Course  I have reviewed the triage vital signs and the nursing notes.  Pertinent labs & imaging results that were available during my care of the patient were reviewed by me and considered in my medical decision making (see chart for details).  Clinical Course     Atypical for ACS, delta troponins and ecg ok.  Negative ct for PE/dissection/PTX/PNA or other acute causes Symptoms didn't return in ED even on monitor for >4.5 hours.   Plan for dc with pcp follow up.  Final Clinical Impressions(s) / ED Diagnoses   Final diagnoses:  Chest pain, unspecified type    New Prescriptions Discharge Medication List as of 07/07/2016 10:04 PM       Merrily Pew, MD 07/08/16 [id number]

## 2016-07-07 NOTE — ED Notes (Signed)
Advised pt we need urine states she just went and no one advised her we needed a sample

## 2016-07-07 NOTE — ED Notes (Signed)
Daughter Holley Dexter wants to be called when pt is discharged [phone]

## 2016-07-07 NOTE — ED Notes (Signed)
Patient transported to CT 

## 2016-07-18 ENCOUNTER — Other Ambulatory Visit: Payer: PPO

## 2016-07-24 DIAGNOSIS — N183 Chronic kidney disease, stage 3 (moderate): Secondary | ICD-10-CM | POA: Diagnosis not present

## 2016-07-24 DIAGNOSIS — M797 Fibromyalgia: Secondary | ICD-10-CM | POA: Diagnosis not present

## 2016-07-24 DIAGNOSIS — I517 Cardiomegaly: Secondary | ICD-10-CM | POA: Diagnosis not present

## 2016-07-24 DIAGNOSIS — I10 Essential (primary) hypertension: Secondary | ICD-10-CM | POA: Diagnosis not present

## 2016-07-25 ENCOUNTER — Other Ambulatory Visit: Payer: PPO

## 2016-07-26 ENCOUNTER — Other Ambulatory Visit: Payer: PPO

## 2016-08-09 ENCOUNTER — Other Ambulatory Visit: Payer: PPO

## 2016-08-23 ENCOUNTER — Ambulatory Visit
Admission: RE | Admit: 2016-08-23 | Discharge: 2016-08-23 | Disposition: A | Discharge status: Home / Self Care | Payer: PPO | Source: Ambulatory Visit | Attending: Family Medicine | Admitting: Family Medicine

## 2016-08-23 DIAGNOSIS — N6311 Unspecified lump in the right breast, upper outer quadrant: Secondary | ICD-10-CM | POA: Diagnosis not present

## 2016-08-23 DIAGNOSIS — N6310 Unspecified lump in the right breast, unspecified quadrant: Secondary | ICD-10-CM

## 2017-02-20 ENCOUNTER — Encounter: Payer: Self-pay | Admitting: Obstetrics & Gynecology

## 2017-03-05 ENCOUNTER — Encounter: Payer: Self-pay | Admitting: Obstetrics & Gynecology

## 2017-03-09 DIAGNOSIS — M797 Fibromyalgia: Secondary | ICD-10-CM | POA: Diagnosis not present

## 2017-03-09 DIAGNOSIS — K219 Gastro-esophageal reflux disease without esophagitis: Secondary | ICD-10-CM | POA: Diagnosis not present

## 2017-03-09 DIAGNOSIS — I10 Essential (primary) hypertension: Secondary | ICD-10-CM | POA: Diagnosis not present

## 2017-03-19 ENCOUNTER — Encounter: Payer: Self-pay | Admitting: Obstetrics & Gynecology

## 2017-03-30 ENCOUNTER — Encounter: Payer: Self-pay | Admitting: Obstetrics & Gynecology

## 2017-04-17 ENCOUNTER — Encounter: Payer: Self-pay | Admitting: Obstetrics & Gynecology

## 2018-06-04 IMAGING — CT CT HEAD W/O CM
3 series · 16 of 47 positions shown, 19 images · non-contrast
Comparison: PET-CT 10/31/2007

CLINICAL DATA: Per Husband, pt reports pain in left chest radiating
to left arm, left arm numbness, right arm numbness, mouth numbness,
slurred speech, and difficulty speaking. Symptoms started around
1422 per husband. Hx of uterine cancer, breast surgery/bbj

EXAM:
CT HEAD WITHOUT CONTRAST
TECHNIQUE: Contiguous axial images were obtained from the base of the skull
through the vertex without intravenous contrast.

[Series 2: head wo · axial · 0.45mm/px · z∈[+1507,+1642]mm · 10 of 33 slices shown, 13 images]
[im 3/33  brain]
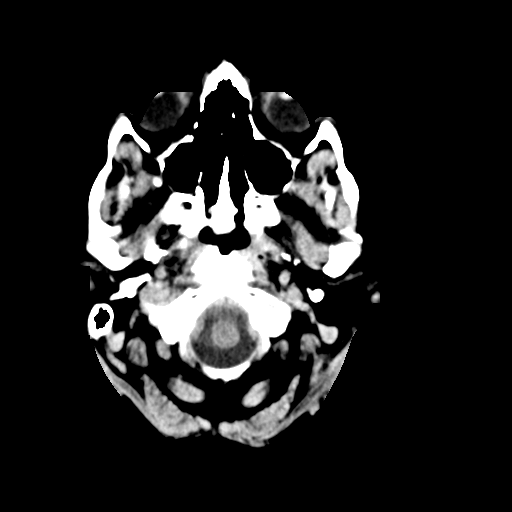
[im 3/33  bone]
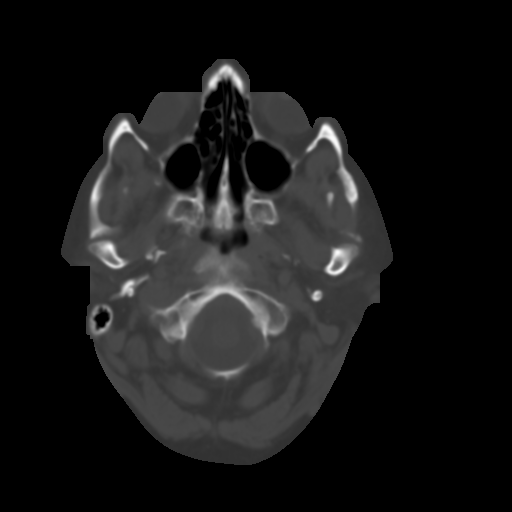
[im 6/33  brain]
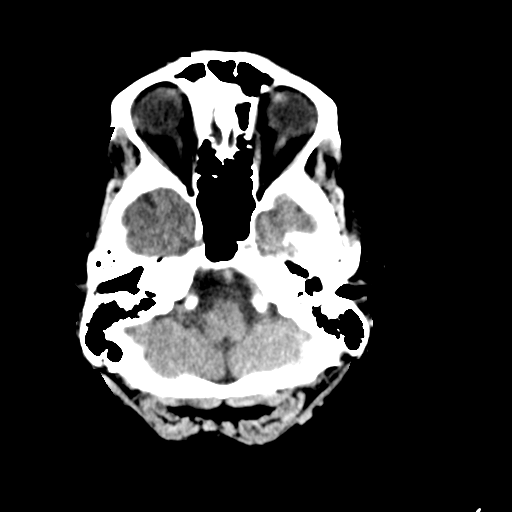
[im 9/33  brain]
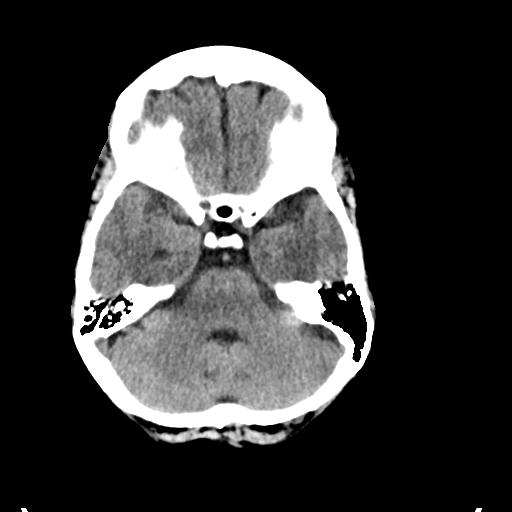
[im 12/33  brain]
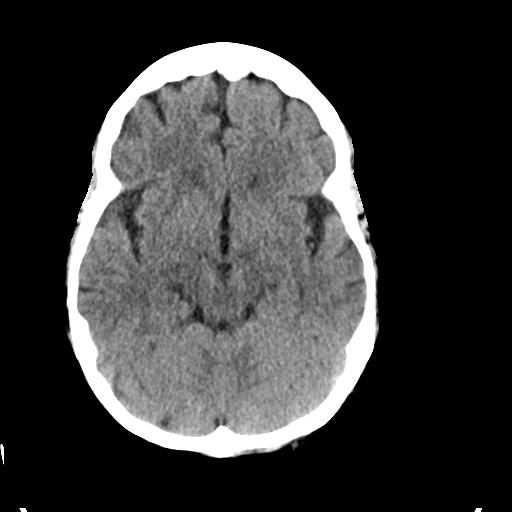
[im 15/33  brain]
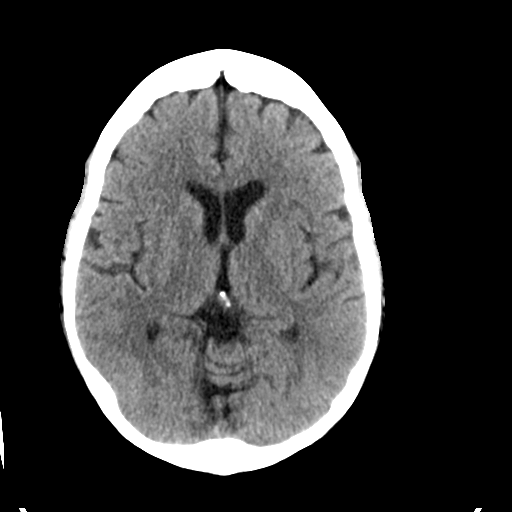
[im 15/33  bone]
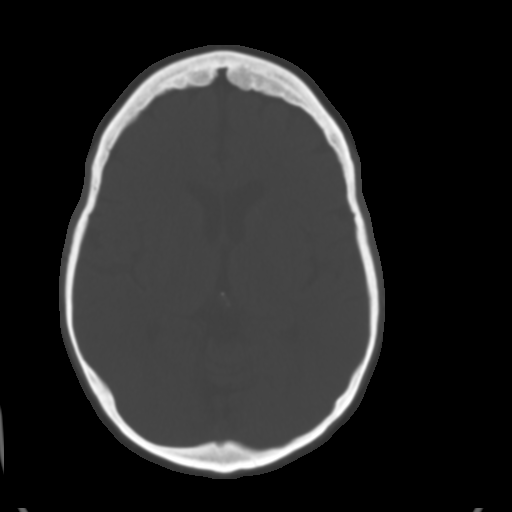
[im 18/33  brain]
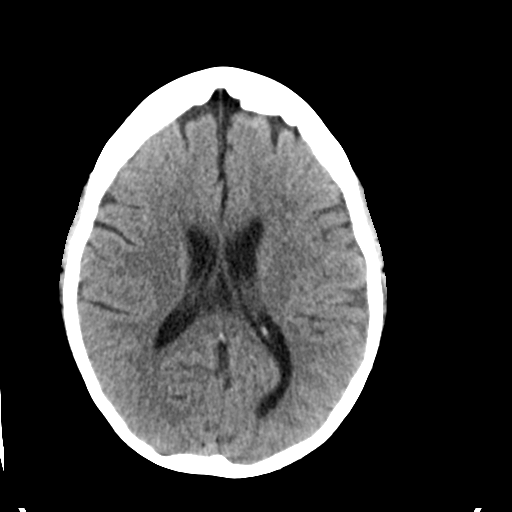
[im 21/33  brain]
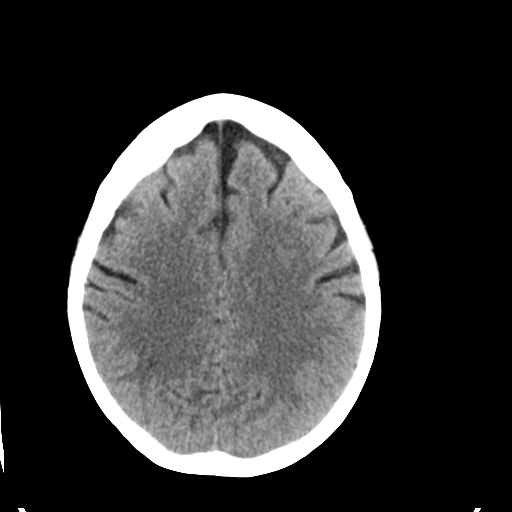
[im 25/33  brain]
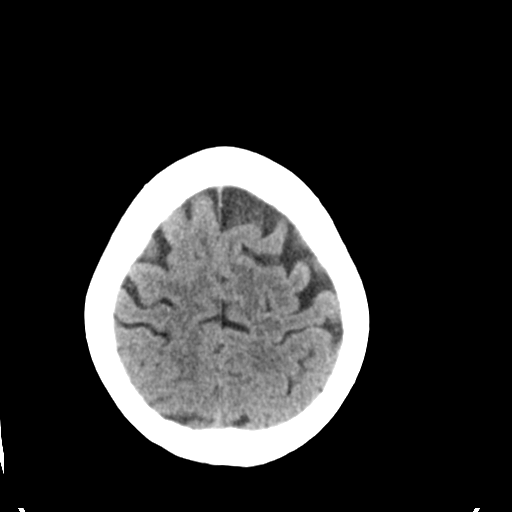
[im 27/33  brain]
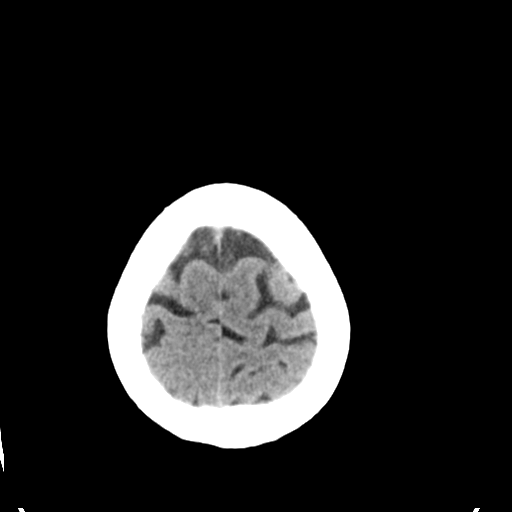
[im 27/33  bone]
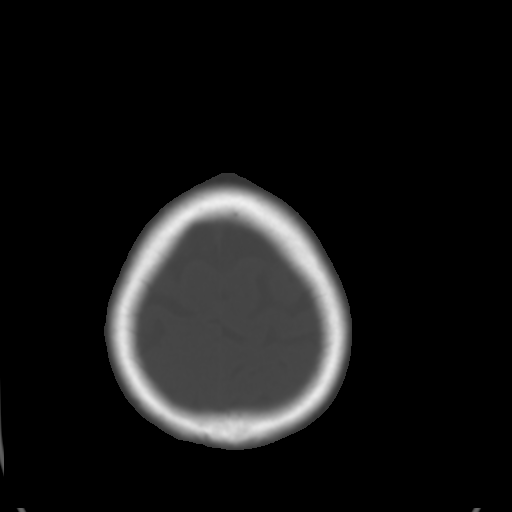
[im 30/33  brain]
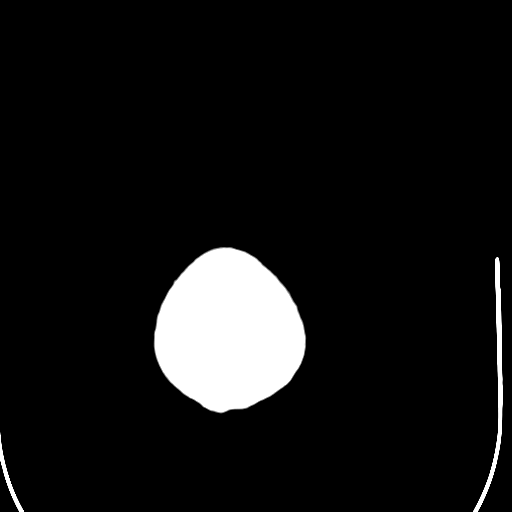

[Series 4: coronal soft tissue · coronal · 0.32mm/px · 3 of 68 slices shown]
[im 23/68  brain]
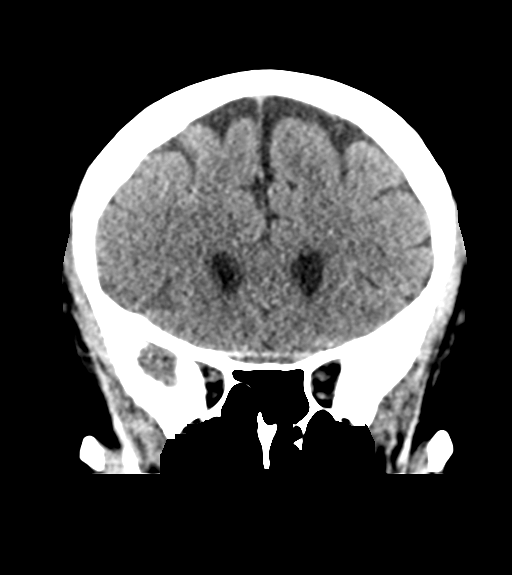
[im 30/68  brain]
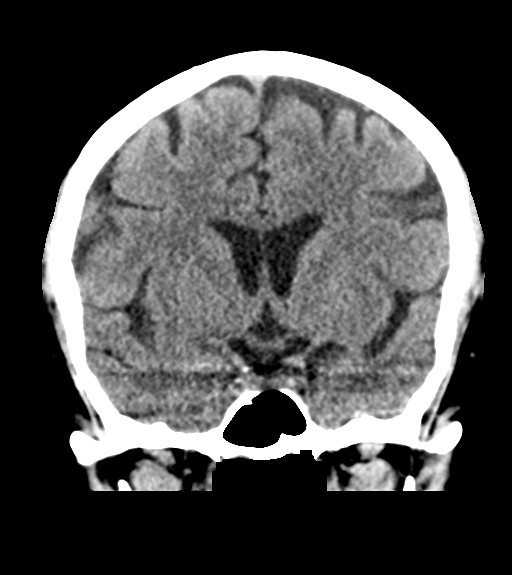
[im 38/68  brain]
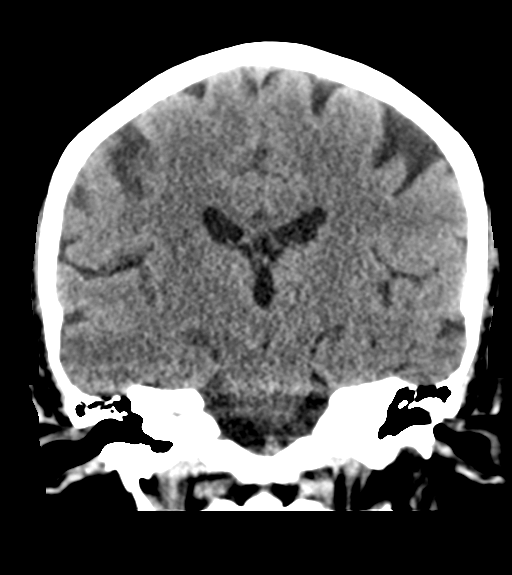

[Series 5: sagittal soft tissue · sagittal · 0.34mm/px · 3 of 67 slices shown]
[im 23/67  brain]
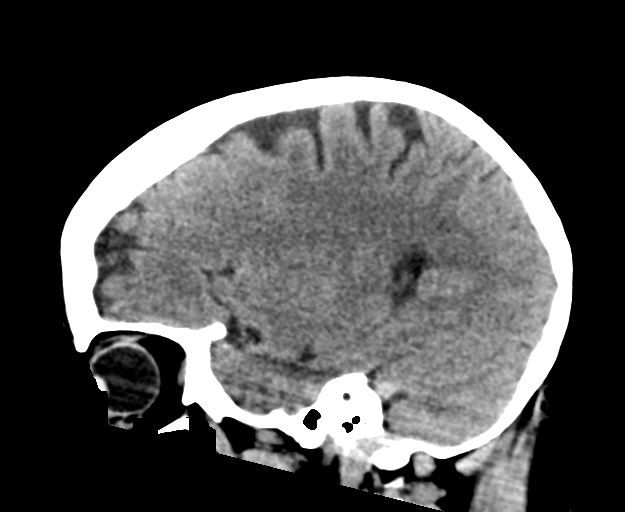
[im 34/67  brain]
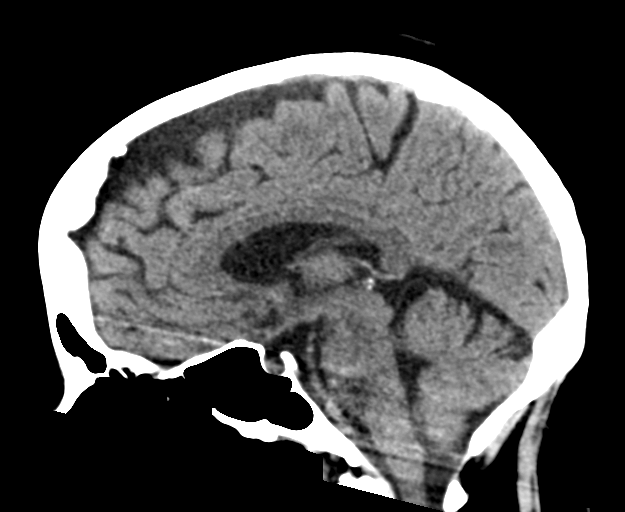
[im 45/67  brain]
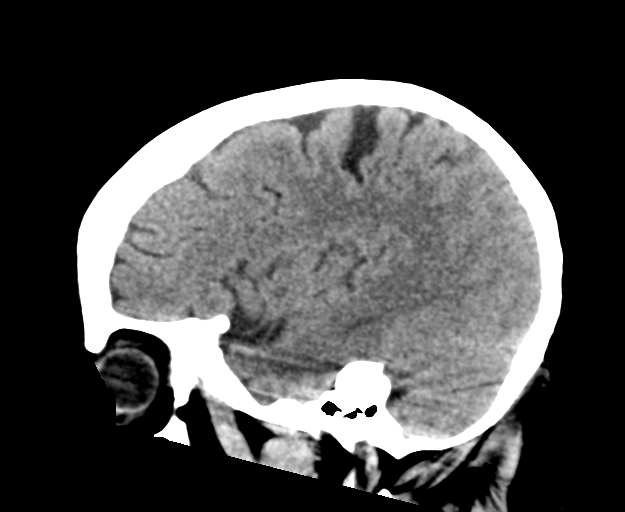

[16 of 47 positions shown; findings below may reference images not displayed]

FINDINGS: Brain: No evidence of acute infarction, hemorrhage, hydrocephalus,
extra-axial collection or mass lesion/mass effect.

Vascular: No hyperdense vessel or unexpected calcification.

Skull: Normal. Negative for fracture or focal lesion.

Sinuses/Orbits: No acute finding.

Other: None.
IMPRESSION: No acute intracranial findings.

## 2019-08-22 DIAGNOSIS — Z79899 Other long term (current) drug therapy: Secondary | ICD-10-CM | POA: Diagnosis not present

## 2019-08-22 DIAGNOSIS — F329 Major depressive disorder, single episode, unspecified: Secondary | ICD-10-CM | POA: Diagnosis not present

## 2019-08-22 DIAGNOSIS — M797 Fibromyalgia: Secondary | ICD-10-CM | POA: Diagnosis not present

## 2019-10-17 DIAGNOSIS — M797 Fibromyalgia: Secondary | ICD-10-CM | POA: Diagnosis not present

## 2019-10-17 DIAGNOSIS — F329 Major depressive disorder, single episode, unspecified: Secondary | ICD-10-CM | POA: Diagnosis not present

## 2019-10-17 DIAGNOSIS — Z79899 Other long term (current) drug therapy: Secondary | ICD-10-CM | POA: Diagnosis not present

## 2019-10-20 DIAGNOSIS — Z853 Personal history of malignant neoplasm of breast: Secondary | ICD-10-CM | POA: Diagnosis not present

## 2019-10-20 DIAGNOSIS — Z8542 Personal history of malignant neoplasm of other parts of uterus: Secondary | ICD-10-CM | POA: Diagnosis not present

## 2019-10-20 DIAGNOSIS — G4700 Insomnia, unspecified: Secondary | ICD-10-CM | POA: Diagnosis not present

## 2019-10-20 DIAGNOSIS — F419 Anxiety disorder, unspecified: Secondary | ICD-10-CM | POA: Diagnosis not present

## 2019-11-14 DIAGNOSIS — F329 Major depressive disorder, single episode, unspecified: Secondary | ICD-10-CM | POA: Diagnosis not present

## 2019-11-14 DIAGNOSIS — Z79899 Other long term (current) drug therapy: Secondary | ICD-10-CM | POA: Diagnosis not present

## 2019-11-14 DIAGNOSIS — M797 Fibromyalgia: Secondary | ICD-10-CM | POA: Diagnosis not present

## 2019-11-30 DIAGNOSIS — F411 Generalized anxiety disorder: Secondary | ICD-10-CM | POA: Diagnosis not present

## 2019-12-15 DIAGNOSIS — Z79899 Other long term (current) drug therapy: Secondary | ICD-10-CM | POA: Diagnosis not present

## 2019-12-15 DIAGNOSIS — F329 Major depressive disorder, single episode, unspecified: Secondary | ICD-10-CM | POA: Diagnosis not present

## 2019-12-15 DIAGNOSIS — F419 Anxiety disorder, unspecified: Secondary | ICD-10-CM | POA: Diagnosis not present

## 2019-12-15 DIAGNOSIS — M797 Fibromyalgia: Secondary | ICD-10-CM | POA: Diagnosis not present

## 2020-01-09 DIAGNOSIS — F329 Major depressive disorder, single episode, unspecified: Secondary | ICD-10-CM | POA: Diagnosis not present

## 2020-01-09 DIAGNOSIS — Z79899 Other long term (current) drug therapy: Secondary | ICD-10-CM | POA: Diagnosis not present

## 2020-01-09 DIAGNOSIS — M797 Fibromyalgia: Secondary | ICD-10-CM | POA: Diagnosis not present

## 2020-03-22 DIAGNOSIS — Z79899 Other long term (current) drug therapy: Secondary | ICD-10-CM | POA: Diagnosis not present

## 2020-03-22 DIAGNOSIS — F419 Anxiety disorder, unspecified: Secondary | ICD-10-CM | POA: Diagnosis not present

## 2020-03-22 DIAGNOSIS — G4700 Insomnia, unspecified: Secondary | ICD-10-CM | POA: Diagnosis not present

## 2020-03-22 DIAGNOSIS — F329 Major depressive disorder, single episode, unspecified: Secondary | ICD-10-CM | POA: Diagnosis not present

## 2020-03-22 DIAGNOSIS — M797 Fibromyalgia: Secondary | ICD-10-CM | POA: Diagnosis not present

## 2020-05-20 DIAGNOSIS — Z79899 Other long term (current) drug therapy: Secondary | ICD-10-CM | POA: Diagnosis not present

## 2020-05-20 DIAGNOSIS — Z9189 Other specified personal risk factors, not elsewhere classified: Secondary | ICD-10-CM | POA: Diagnosis not present

## 2020-05-20 DIAGNOSIS — M797 Fibromyalgia: Secondary | ICD-10-CM | POA: Diagnosis not present

## 2020-05-20 DIAGNOSIS — F329 Major depressive disorder, single episode, unspecified: Secondary | ICD-10-CM | POA: Diagnosis not present

## 2022-02-23 ENCOUNTER — Other Ambulatory Visit: Payer: Self-pay

## 2022-02-23 ENCOUNTER — Emergency Department (HOSPITAL_COMMUNITY): Payer: Medicare HMO

## 2022-02-23 ENCOUNTER — Emergency Department (HOSPITAL_COMMUNITY)
Admission: EM | Admit: 2022-02-23 | Discharge: 2022-02-23 | Disposition: A | Discharge status: Home / Self Care | Payer: Medicare HMO | Attending: Emergency Medicine | Admitting: Emergency Medicine

## 2022-02-23 ENCOUNTER — Encounter (HOSPITAL_COMMUNITY): Payer: Self-pay

## 2022-02-23 DIAGNOSIS — T424X1A Poisoning by benzodiazepines, accidental (unintentional), initial encounter: Secondary | ICD-10-CM | POA: Insufficient documentation

## 2022-02-23 DIAGNOSIS — R109 Unspecified abdominal pain: Secondary | ICD-10-CM | POA: Insufficient documentation

## 2022-02-23 DIAGNOSIS — N12 Tubulo-interstitial nephritis, not specified as acute or chronic: Secondary | ICD-10-CM | POA: Insufficient documentation

## 2022-02-23 DIAGNOSIS — F10120 Alcohol abuse with intoxication, uncomplicated: Secondary | ICD-10-CM | POA: Insufficient documentation

## 2022-02-23 DIAGNOSIS — R4781 Slurred speech: Secondary | ICD-10-CM | POA: Diagnosis not present

## 2022-02-23 DIAGNOSIS — F10920 Alcohol use, unspecified with intoxication, uncomplicated: Secondary | ICD-10-CM

## 2022-02-23 DIAGNOSIS — Z87442 Personal history of urinary calculi: Secondary | ICD-10-CM | POA: Diagnosis not present

## 2022-02-23 DIAGNOSIS — T50901A Poisoning by unspecified drugs, medicaments and biological substances, accidental (unintentional), initial encounter: Secondary | ICD-10-CM

## 2022-02-23 DIAGNOSIS — Y908 Blood alcohol level of 240 mg/100 ml or more: Secondary | ICD-10-CM | POA: Diagnosis not present

## 2022-02-23 LAB — COMPREHENSIVE METABOLIC PANEL
ALT: 23 U/L (ref 0–44)
AST: 22 U/L (ref 15–41)
Albumin: 4 g/dL (ref 3.5–5.0)
Alkaline Phosphatase: 75 U/L (ref 38–126)
Anion gap: 8 (ref 5–15)
BUN: 12 mg/dL (ref 8–23)
CO2: 23 mmol/L (ref 22–32)
Calcium: 9 mg/dL (ref 8.9–10.3)
Chloride: 108 mmol/L (ref 98–111)
Creatinine, Ser: 0.85 mg/dL (ref 0.44–1.00)
GFR, Estimated: >60 mL/min (ref >60)
Glucose, Bld: 111 mg/dL — ABNORMAL HIGH (ref 70–99)
Potassium: 3.6 mmol/L (ref 3.5–5.1)
Sodium: 139 mmol/L (ref 135–145)
Total Bilirubin: 0.6 mg/dL (ref 0.3–1.2)
Total Protein: 7.5 g/dL (ref 6.5–8.1)

## 2022-02-23 LAB — URINALYSIS, ROUTINE W REFLEX MICROSCOPIC
Bilirubin Urine: NEGATIVE
Glucose, UA: NEGATIVE mg/dL
Hgb urine dipstick: NEGATIVE
Ketones, ur: NEGATIVE mg/dL
Leukocytes,Ua: NEGATIVE
Nitrite: NEGATIVE
Protein, ur: NEGATIVE mg/dL
Specific Gravity, Urine: 1.006 (ref 1.005–1.030)
pH: 6 (ref 5.0–8.0)

## 2022-02-23 LAB — CBC WITH DIFFERENTIAL/PLATELET
Abs Immature Granulocytes: 0.03 10*3/uL (ref 0.00–0.07)
Basophils Absolute: 0.1 10*3/uL (ref 0.0–0.1)
Basophils Relative: 1 %
Eosinophils Absolute: 0.4 10*3/uL (ref 0.0–0.5)
Eosinophils Relative: 4 %
HCT: 42.9 % (ref 36.0–46.0)
Hemoglobin: 14.5 g/dL (ref 12.0–15.0)
Immature Granulocytes: 0 %
Lymphocytes Relative: 33 %
Lymphs Abs: 3.8 10*3/uL (ref 0.7–4.0)
MCH: 31.9 pg (ref 26.0–34.0)
MCHC: 33.8 g/dL (ref 30.0–36.0)
MCV: 94.3 fL (ref 80.0–100.0)
Monocytes Absolute: 0.9 10*3/uL (ref 0.1–1.0)
Monocytes Relative: 8 %
Neutro Abs: 6.1 10*3/uL (ref 1.7–7.7)
Neutrophils Relative %: 54 %
Platelets: 255 10*3/uL (ref 150–400)
RBC: 4.55 MIL/uL (ref 3.87–5.11)
RDW: 12.1 % (ref 11.5–15.5)
WBC: 11.3 10*3/uL — ABNORMAL HIGH (ref 4.0–10.5)
nRBC: 0 % (ref 0.0–0.2)

## 2022-02-23 LAB — RAPID URINE DRUG SCREEN, HOSP PERFORMED
Amphetamines: NOT DETECTED
Barbiturates: NOT DETECTED
Benzodiazepines: POSITIVE — AB
Cocaine: NOT DETECTED
Opiates: NOT DETECTED
Tetrahydrocannabinol: NOT DETECTED

## 2022-02-23 LAB — ETHANOL: Alcohol, Ethyl (B): 248 mg/dL — ABNORMAL HIGH (ref <10)

## 2022-02-23 MED ORDER — ONDANSETRON HCL 4 MG/2ML IJ SOLN
4.0000 mg | Freq: Once | INTRAMUSCULAR | Status: AC
  Administered 2022-02-23: 4 mg via INTRAVENOUS
  Filled 2022-02-23: qty 2

## 2022-02-23 MED ORDER — IOHEXOL 300 MG/ML  SOLN
100.0000 mL | Freq: Once | INTRAMUSCULAR | Status: AC | PRN
  Administered 2022-02-23: 100 mL via INTRAVENOUS

## 2022-02-23 MED ORDER — LACTATED RINGERS IV BOLUS
1000.0000 mL | Freq: Once | INTRAVENOUS | Status: AC
  Administered 2022-02-23: 1000 mL via INTRAVENOUS

## 2022-02-23 NOTE — ED Notes (Signed)
Patient continues to state she needs to pee despite minimal urine in suction canister. This nurse bladder scanned patient and bladder contained <661m of urine. Dr. TLangston Maskermade aware. Awaiting orders.

## 2022-02-23 NOTE — ED Notes (Signed)
Pt care taken, went to the restroom and was able to urinate. Md notified.

## 2022-02-23 NOTE — ED Notes (Signed)
Sandwich offered to patient, and she is not hungry now

## 2022-02-23 NOTE — ED Provider Notes (Signed)
[facility] EMERGENCY DEPARTMENT Provider Note   CSN: [id number] Arrival date & time: 02/23/22  0404     History  Chief Complaint  Patient presents with   Drug Overdose    Kirsten Mendoza is a 61 y.o. female.  Patient presents to the emergency department for evaluation of overdose.  Patient reportedly took 6 of her Klonopin tablets and drink vodka tonight.  She reports that she was not trying to kill herself.  She ran out of of her Ambien and was trying to get to sleep.       Home Medications Prior to Admission medications   Medication Sig Start Date End Date Taking? Authorizing Provider  clonazePAM (KLONOPIN) 1 MG tablet Take 1 mg by mouth 4 (four) times daily as needed for anxiety.    [provider]  DULoxetine (CYMBALTA) 60 MG capsule Take 60 mg by mouth daily.    [provider]  gabapentin (NEURONTIN) 600 MG tablet Take 300 mg by mouth 3 (three) times daily.    [provider]  traMADol (ULTRAM) 50 MG tablet Take 50 mg by mouth every 6 (six) hours as needed for moderate pain or severe pain.    [provider]  zolpidem (AMBIEN) 5 MG tablet Take 5 mg by mouth at bedtime as needed for sleep.    [provider]      Allergies    Penicillins and Sulfa antibiotics    Review of Systems   Review of Systems  Physical Exam Updated Vital Signs BP 102/75   Pulse 82   Temp 98.1 F (36.7 C)   Resp (!) 23   SpO2 98%  Physical Exam Vitals and nursing note reviewed.  Constitutional:      General: She is not in acute distress.    Appearance: She is well-developed.  HENT:     Head: Normocephalic and atraumatic.     Mouth/Throat:     Mouth: Mucous membranes are moist.  Eyes:     General: Vision grossly intact. Gaze aligned appropriately.     Extraocular Movements: Extraocular movements intact.     Conjunctiva/sclera: Conjunctivae normal.  Cardiovascular:     Rate and Rhythm: Normal rate and regular rhythm.     Pulses: Normal  pulses.     Heart sounds: Normal heart sounds, S1 normal and S2 normal. No murmur heard.    No friction rub. No gallop.  Pulmonary:     Effort: Pulmonary effort is normal. No respiratory distress.     Breath sounds: Normal breath sounds.  Abdominal:     General: Bowel sounds are normal.     Palpations: Abdomen is soft.     Tenderness: There is no abdominal tenderness. There is no guarding or rebound.     Hernia: No hernia is present.  Musculoskeletal:        General: No swelling.     Cervical back: Full passive range of motion without pain, normal range of motion and neck supple. No spinous process tenderness or muscular tenderness. Normal range of motion.     Right lower leg: No edema.     Left lower leg: No edema.  Skin:    General: Skin is warm and dry.     Capillary Refill: Capillary refill takes less than 2 seconds.     Findings: No ecchymosis, erythema, rash or wound.  Neurological:     General: No focal deficit present.     Mental Status: She is alert.  GCS: GCS eye subscore is 4. GCS verbal subscore is 5. GCS motor subscore is 6.     Cranial Nerves: Cranial nerves 2-12 are intact.     Sensory: Sensation is intact.     Motor: Motor function is intact.     Coordination: Coordination is intact.  Psychiatric:        Speech: Speech is slurred.     ED Results / Procedures / Treatments   Labs (all labs ordered are listed, but only abnormal results are displayed) Labs Reviewed  CBC WITH DIFFERENTIAL/PLATELET - Abnormal; Notable for the following components:      Result Value   WBC 11.3 (*)    All other components within normal limits  COMPREHENSIVE METABOLIC PANEL - Abnormal; Notable for the following components:   Glucose, Bld 111 (*)    All other components within normal limits  ETHANOL - Abnormal; Notable for the following components:   Alcohol, Ethyl (B) 248 (*)    All other components within normal limits  RAPID URINE DRUG SCREEN, HOSP PERFORMED    EKG EKG  Interpretation  Date/Time:  Thursday February 23 2022 04:15:29 EDT Ventricular Rate:  84 PR Interval:  160 QRS Duration: 105 QT Interval:  391 QTC Calculation: 463 R Axis:   87 Text Interpretation: Sinus rhythm Borderline right axis deviation Low voltage, precordial leads Confirmed by Octaviano Glow [phone] on 02/23/2022 7:47:02 AM  Radiology No results found.  Procedures Procedures    Medications Ordered in ED Medications  ondansetron (ZOFRAN) injection 4 mg (4 mg Intravenous Given 02/23/22 0510)    ED Course/ Medical Decision Making/ A&P                           Medical Decision Making Amount and/or Complexity of Data Reviewed Labs: ordered.  Risk Prescription drug management.   Patient presents to the emergency department for intoxication.  Patient reports that she ran out of her Ambien and substituted multiple doses of Klonopin and drank some vodka.  She is intoxicated on arrival but stable.  She reports that this was not a suicide attempt.  Vital signs have remained stable.  We will continue to monitor.  Will sign to oncoming ER physician to reevaluate when she is more sober.        Final Clinical Impression(s) / ED Diagnoses Final diagnoses:  Alcoholic intoxication without complication ([facility])  Accidental overdose, initial encounter    Rx / DC Orders ED Discharge Orders     None         Raymonde Hamblin, Gwenyth Allegra, MD 02/23/22 [phone]

## 2022-02-23 NOTE — ED Triage Notes (Addendum)
Pt presents with drug overdose after taking six 2 mg klonopin. Pt also drank a half a bottle of absolute. Pt has slurred speech at this time.   Pt denies trying to hurt herself and denies wanting to be dead

## 2022-02-23 NOTE — ED Provider Notes (Signed)
  Physical Exam  BP 124/76 (BP Location: Right Arm)   Pulse 84   Temp 98.2 F (36.8 C) (Oral)   Resp 18   SpO2 96%   Physical Exam Vitals and nursing note reviewed.  Constitutional:      General: She is not in acute distress.    Appearance: She is well-developed.  HENT:     Head: Normocephalic and atraumatic.  Eyes:     Conjunctiva/sclera: Conjunctivae normal.  Cardiovascular:     Rate and Rhythm: Normal rate and regular rhythm.     Heart sounds: No murmur heard. Pulmonary:     Effort: Pulmonary effort is normal. No respiratory distress.  Musculoskeletal:        General: No swelling.     Cervical back: Neck supple.  Skin:    General: Skin is warm and dry.     Capillary Refill: Capillary refill takes less than 2 seconds.  Neurological:     Mental Status: She is alert.  Psychiatric:        Mood and Affect: Mood normal.     Procedures  Procedures  ED Course / MDM   Clinical Course as of 02/23/22 2335  Thu Feb 23, 2022  1029 Patient reporting inability to urinate, bladder scan showing 600 cc of urine.  I asked nurse to insert Foley catheter at this time.  This may be a side effect of her overdose medication.  We will check for UTI. [MT]  30 MTF [MK]    Clinical Course User Index [MK] Jr Milliron, MD [MT] Langston Masker, Carola Rhine, MD   Medical Decision Making Amount and/or Complexity of Data Reviewed Labs: ordered. Radiology: ordered.  Risk Prescription drug management.   Patient received in handoff.  Accidental drug overdose and alcohol intoxication pending metabolization and urinary voiding trial.  Urinary catheter was ultimately removed and the patient was able to urinate multiple times here in the emergency department.  I did a bedside ultrasound and there is no retained urine.  I had an extensive discussion with the patient about what happened last night and she states she was just trying to get some sleep and she adamantly denies suicidal ideation.  Patient  then discharged with return precautions.       Teressa Lower, MD 02/23/22 [phone]

## 2022-02-23 NOTE — ED Notes (Signed)
Patient sat up in the bed and given ginger ale and lays potato chips at this time and updated on need to eat and ambulate prior to discharge.

## 2022-02-23 NOTE — ED Provider Notes (Signed)
Pt presenting with accidental overdose, having took a dose of Klonopin along with drinking alcohol.  Vitals are stable here.  Patient does appear intoxicated.  She will need to be monitored for improved sobriety.  She denied SI to the ED provider earlier.  States she had run out of her typical evening sleeping medicine with Ambien and therefore took a larger dose of Klonopin.  Labs reviewed and unremarkable aside from alcohol level elevated at 248.  Patient does have improving mentation and vital signs have been stable but she remains quite somnolent, likely lingering effect of both the Klonopin and the alcohol.  She simply needs to be monitored longer for improving sobriety.  I suspect urinary retention was a side effect of the Klonopin.  We can either try a void trial when she is more sober, or else she can be discharged with a Foley catheter and instructions for have urology follow-up in 1 week to have a void trial.  Signed out to EDP   Wyvonnia Dusky, MD 02/23/22 1429

## 2022-02-23 NOTE — ED Notes (Signed)
Patient alert and oriented X3.

## 2022-02-23 NOTE — ED Notes (Signed)
Patient ambulated approximately 25 feet without assistance. Patient spoke with Dr. Matilde Sprang who stated she wanted the foley out and attempt to pee. Patient states she does not want to go home with a foley. Patient verbalized understanding of the need for foley if she is unable to pee without foley.

## 2023-08-23 DIAGNOSIS — G4700 Insomnia, unspecified: Secondary | ICD-10-CM | POA: Diagnosis not present

## 2023-08-23 DIAGNOSIS — E7800 Pure hypercholesterolemia, unspecified: Secondary | ICD-10-CM | POA: Diagnosis not present

## 2023-08-23 DIAGNOSIS — Z299 Encounter for prophylactic measures, unspecified: Secondary | ICD-10-CM | POA: Diagnosis not present

## 2023-08-23 DIAGNOSIS — J45909 Unspecified asthma, uncomplicated: Secondary | ICD-10-CM | POA: Diagnosis not present

## 2023-08-23 DIAGNOSIS — Z7189 Other specified counseling: Secondary | ICD-10-CM | POA: Diagnosis not present

## 2023-08-23 DIAGNOSIS — Z0000 Encounter for general adult medical examination without abnormal findings: Secondary | ICD-10-CM | POA: Diagnosis not present

## 2023-08-23 DIAGNOSIS — Z79899 Other long term (current) drug therapy: Secondary | ICD-10-CM | POA: Diagnosis not present

## 2023-08-23 DIAGNOSIS — R5383 Other fatigue: Secondary | ICD-10-CM | POA: Diagnosis not present

## 2023-09-04 ENCOUNTER — Other Ambulatory Visit: Payer: Self-pay | Admitting: Internal Medicine

## 2023-09-04 DIAGNOSIS — Z1231 Encounter for screening mammogram for malignant neoplasm of breast: Secondary | ICD-10-CM

## 2023-09-11 ENCOUNTER — Inpatient Hospital Stay: Admission: RE | Admit: 2023-09-11 | Payer: Medicare HMO | Source: Ambulatory Visit

## 2023-10-09 ENCOUNTER — Ambulatory Visit
Admission: RE | Admit: 2023-10-09 | Discharge: 2023-10-09 | Disposition: A | Discharge status: Home / Self Care | Source: Ambulatory Visit | Attending: Internal Medicine | Admitting: Internal Medicine

## 2023-10-09 DIAGNOSIS — Z1231 Encounter for screening mammogram for malignant neoplasm of breast: Secondary | ICD-10-CM

## 2023-11-20 DIAGNOSIS — Z299 Encounter for prophylactic measures, unspecified: Secondary | ICD-10-CM | POA: Diagnosis not present

## 2023-11-20 DIAGNOSIS — G4700 Insomnia, unspecified: Secondary | ICD-10-CM | POA: Diagnosis not present

## 2023-11-20 DIAGNOSIS — R52 Pain, unspecified: Secondary | ICD-10-CM | POA: Diagnosis not present

## 2024-03-07 DIAGNOSIS — G4700 Insomnia, unspecified: Secondary | ICD-10-CM | POA: Diagnosis not present

## 2024-03-07 DIAGNOSIS — R569 Unspecified convulsions: Secondary | ICD-10-CM | POA: Diagnosis not present

## 2024-03-07 DIAGNOSIS — E7800 Pure hypercholesterolemia, unspecified: Secondary | ICD-10-CM | POA: Diagnosis not present

## 2024-03-20 DIAGNOSIS — R599 Enlarged lymph nodes, unspecified: Secondary | ICD-10-CM | POA: Diagnosis not present

## 2024-03-20 DIAGNOSIS — Z853 Personal history of malignant neoplasm of breast: Secondary | ICD-10-CM | POA: Diagnosis not present

## 2024-03-20 DIAGNOSIS — G4700 Insomnia, unspecified: Secondary | ICD-10-CM | POA: Diagnosis not present

## 2024-03-20 DIAGNOSIS — M797 Fibromyalgia: Secondary | ICD-10-CM | POA: Diagnosis not present

## 2024-03-26 DIAGNOSIS — Z853 Personal history of malignant neoplasm of breast: Secondary | ICD-10-CM | POA: Diagnosis not present

## 2024-03-26 DIAGNOSIS — R2232 Localized swelling, mass and lump, left upper limb: Secondary | ICD-10-CM | POA: Diagnosis not present
